# Patient Record
Sex: Male | Born: 1955 | Race: White | Hispanic: No | Marital: Single | Smoking: Current every day smoker
Health system: Southern US, Community
[De-identification: ages and names within clinical notes are randomized; demographics above are authoritative.]

## PROBLEM LIST (undated history)

## (undated) DIAGNOSIS — E7800 Pure hypercholesterolemia, unspecified: Secondary | ICD-10-CM

## (undated) DIAGNOSIS — N529 Male erectile dysfunction, unspecified: Secondary | ICD-10-CM

## (undated) DIAGNOSIS — K219 Gastro-esophageal reflux disease without esophagitis: Secondary | ICD-10-CM

## (undated) DIAGNOSIS — F419 Anxiety disorder, unspecified: Secondary | ICD-10-CM

## (undated) DIAGNOSIS — C4490 Unspecified malignant neoplasm of skin, unspecified: Secondary | ICD-10-CM

## (undated) DIAGNOSIS — M1990 Unspecified osteoarthritis, unspecified site: Secondary | ICD-10-CM

## (undated) DIAGNOSIS — I10 Essential (primary) hypertension: Secondary | ICD-10-CM

## (undated) DIAGNOSIS — I824Z2 Acute embolism and thrombosis of unspecified deep veins of left distal lower extremity: Secondary | ICD-10-CM

## (undated) DIAGNOSIS — G4730 Sleep apnea, unspecified: Secondary | ICD-10-CM

## (undated) HISTORY — DX: Gastro-esophageal reflux disease without esophagitis: K21.9

## (undated) HISTORY — DX: Pure hypercholesterolemia, unspecified: E78.00

## (undated) HISTORY — PX: SKIN BIOPSY: SHX1

## (undated) HISTORY — DX: Sleep apnea, unspecified: G47.30

## (undated) HISTORY — DX: Acute embolism and thrombosis of unspecified deep veins of left distal lower extremity: I82.4Z2

## (undated) HISTORY — PX: KNEE SURGERY: SHX244

## (undated) HISTORY — DX: Essential (primary) hypertension: I10

## (undated) HISTORY — PX: MELANOMA EXCISION: SHX5266

## (undated) HISTORY — DX: Anxiety disorder, unspecified: F41.9

## (undated) HISTORY — PX: TONSILLECTOMY: SUR1361

---

## 2018-08-18 ENCOUNTER — Emergency Department
Admission: EM | Admit: 2018-08-18 | Discharge: 2018-08-18 | Disposition: A | Discharge status: Home / Self Care | Payer: Self-pay | Attending: Emergency Medicine | Admitting: Emergency Medicine

## 2018-08-18 ENCOUNTER — Emergency Department: Payer: Self-pay

## 2018-08-18 ENCOUNTER — Encounter: Payer: Self-pay | Admitting: Emergency Medicine

## 2018-08-18 DIAGNOSIS — R3911 Hesitancy of micturition: Secondary | ICD-10-CM | POA: Insufficient documentation

## 2018-08-18 DIAGNOSIS — R350 Frequency of micturition: Secondary | ICD-10-CM | POA: Insufficient documentation

## 2018-08-18 DIAGNOSIS — F17210 Nicotine dependence, cigarettes, uncomplicated: Secondary | ICD-10-CM | POA: Insufficient documentation

## 2018-08-18 DIAGNOSIS — R3915 Urgency of urination: Secondary | ICD-10-CM | POA: Insufficient documentation

## 2018-08-18 HISTORY — DX: Unspecified osteoarthritis, unspecified site: M19.90

## 2018-08-18 HISTORY — DX: Unspecified malignant neoplasm of skin, unspecified: C44.90

## 2018-08-18 HISTORY — DX: Male erectile dysfunction, unspecified: N52.9

## 2018-08-18 LAB — BASIC METABOLIC PANEL
ANION GAP: 9 (ref 5–15)
BUN: 14 mg/dL (ref 8–23)
CO2: 29 mmol/L (ref 22–32)
CREATININE: 1.2 mg/dL (ref 0.61–1.24)
Calcium: 9.4 mg/dL (ref 8.9–10.3)
Chloride: 103 mmol/L (ref 98–111)
GFR calc Af Amer: >60 mL/min (ref >60)
GLUCOSE: 126 mg/dL — AB (ref 70–99)
Potassium: 3.8 mmol/L (ref 3.5–5.1)
Sodium: 141 mmol/L (ref 135–145)

## 2018-08-18 LAB — CBC
HEMATOCRIT: 46.8 % (ref 40.0–52.0)
Hemoglobin: 16 g/dL (ref 13.0–18.0)
MCH: 30.8 pg (ref 26.0–34.0)
MCHC: 34.2 g/dL (ref 32.0–36.0)
MCV: 90 fL (ref 80.0–100.0)
Platelets: 206 10*3/uL (ref 150–440)
RBC: 5.2 MIL/uL (ref 4.40–5.90)
RDW: 15.1 % — ABNORMAL HIGH (ref 11.5–14.5)
WBC: 6 10*3/uL (ref 3.8–10.6)

## 2018-08-18 LAB — URINALYSIS, COMPLETE (UACMP) WITH MICROSCOPIC
BILIRUBIN URINE: NEGATIVE
Bacteria, UA: NONE SEEN
GLUCOSE, UA: NEGATIVE mg/dL
KETONES UR: 5 mg/dL — AB
LEUKOCYTES UA: NEGATIVE
NITRITE: NEGATIVE
PH: 5 (ref 5.0–8.0)
Protein, ur: NEGATIVE mg/dL
Specific Gravity, Urine: 1.02 (ref 1.005–1.030)
Squamous Epithelial / LPF: NONE SEEN (ref 0–5)

## 2018-08-18 NOTE — ED Notes (Signed)
NAD noted at time of D/C. Pt denies questions or concerns. Pt ambulatory to the lobby at this time.  

## 2018-08-18 NOTE — ED Notes (Signed)
Pt presents to ED with c/c of urinary Sx including increased frequency/urgency and decreased flow when urinating. Also intermittent pain in R flank which pt associates with kidney. Sx have been ongoing for several months. Pt states concern for family Hx of bladder cancer. Secondary complaint of intermittent stool leakage occurring several times per week. Pt reports "wet"sensation after wiping and finds small quantities of liquid stool several minutes after bowel movement. Denies any pain with defication.

## 2018-08-18 NOTE — ED Notes (Signed)
EDP at bedside at this time to discuss results with patient.

## 2018-08-18 NOTE — ED Notes (Signed)
Patient transported to CT 

## 2018-08-18 NOTE — ED Triage Notes (Signed)
Patient presents to the ED with urinary issues, prostate issues and right flank pain.  Patient reports difficulty with ejaculation last week and reports frequent urination at night.  Patient states, I had testosterone pellets put in and I'm on cialis.  Patient states he thought he had a prostate infection and has been taking antibiotics.  Patient states, "I'm peeing clear and I think I need a scan or something to figure out what's going on."  Right flank pain began this morning.

## 2018-08-18 NOTE — ED Provider Notes (Signed)
[facility] Emergency Department Provider Note       Time seen: ----------------------------------------- 1:27 PM on 08/18/2018 -----------------------------------------   I have reviewed the triage vital signs and the nursing notes.  HISTORY   Chief Complaint Flank Pain    HPI Dustin Parsons is a 62 y.o. male with a history of arthritis, erectile dysfunction and skin cancer who presents to the ED for urinary symptoms including increased frequency and urgency with decreased flow.  Patient describes intermittent right flank pain which she associates with his right kidneys.  He states symptoms have been going on for several months.  He denies fevers, chills or other complaints.  Past Medical History:  Diagnosis Date  . Arthritis   . Erectile dysfunction   . Skin cancer     There are no active problems to display for this patient.   Past Surgical History:  Procedure Laterality Date  . KNEE SURGERY    . SKIN BIOPSY    . TONSILLECTOMY      Allergies Patient has no known allergies.  Social History Social History   Tobacco Use  . Smoking status: Current Every Day Smoker    Packs/day: 1.00    Types: Cigarettes  . Smokeless tobacco: Never Used  Substance Use Topics  . Alcohol use: Yes    Comment: weekends  . Drug use: Not on file   Review of Systems Constitutional: Negative for fever. Cardiovascular: Negative for chest pain. Respiratory: Negative for shortness of breath. Gastrointestinal: Positive for flank pain Genitourinary: Positive for polyuria, urinary hesitancy Musculoskeletal: Negative for back pain. Skin: Negative for rash. Neurological: Negative for headaches, focal weakness or numbness.  All systems negative/normal/unremarkable except as stated in the HPI  ____________________________________________   PHYSICAL EXAM:  VITAL SIGNS: ED Triage Vitals  Enc Vitals Group     BP --      Pulse Rate 08/18/18 1054 (!) 101      Resp 08/18/18 1054 18     Temp 08/18/18 1054 98.5 F (36.9 C)     Temp Source 08/18/18 1054 Oral     SpO2 08/18/18 1054 98 %     Weight 08/18/18 1055 215 lb (97.5 kg)     Height 08/18/18 1055 6\' 2"  (1.88 m)     Head Circumference --      Peak Flow --      Pain Score 08/18/18 1055 0     Pain Loc --      Pain Edu? --      Excl. in [place]? --    Constitutional: Alert and oriented. Well appearing and in no distress. Eyes: Conjunctivae are normal. Normal extraocular movements. Cardiovascular: Normal rate, regular rhythm. No murmurs, rubs, or gallops. Respiratory: Normal respiratory effort without tachypnea nor retractions. Breath sounds are clear and equal bilaterally. No wheezes/rales/rhonchi. Gastrointestinal: Soft and nontender. Normal bowel sounds Genitourinary: Circumcised, no tenderness, no swelling or mass, no testicular tenderness Musculoskeletal: Nontender with normal range of motion in extremities. No lower extremity tenderness nor edema. Neurologic:  Normal speech and language. No gross focal neurologic deficits are appreciated.  Skin:  Skin is warm, dry and intact. No rash noted. Psychiatric: Mood and affect are normal. Speech and behavior are normal.  ____________________________________________  ED COURSE:  As part of my medical decision making, I reviewed the following data within the [place] History obtained from family if available, nursing notes, old chart and ekg, as well as notes from prior ED visits. Patient presented for flank  pain and urinary symptoms, we will assess with labs and imaging as indicated at this time.   Procedures ____________________________________________   LABS (pertinent positives/negatives)  Labs Reviewed  URINALYSIS, COMPLETE (UACMP) WITH MICROSCOPIC - Abnormal; Notable for the following components:      Result Value   Color, Urine YELLOW (*)    APPearance CLEAR (*)    Hgb urine dipstick SMALL (*)    Ketones, ur 5 (*)     All other components within normal limits  BASIC METABOLIC PANEL - Abnormal; Notable for the following components:   Glucose, Bld 126 (*)    All other components within normal limits  CBC - Abnormal; Notable for the following components:   RDW 15.1 (*)    All other components within normal limits    RADIOLOGY Images were viewed by me  CT renal protocol IMPRESSION: No obstructive uropathy or nephrolithiasis.  No prostatomegaly.  ____________________________________________  DIFFERENTIAL DIAGNOSIS   UTI, pyelonephritis, renal colic, BPH  FINAL ASSESSMENT AND PLAN  Polyuria, urinary hesitancy   Plan: The patient had presented for urinary symptoms and flank pain. Patient's labs did not reveal any acute process. Patient's imaging was also negative.  He will be referred to urology for outpatient follow-up, potential urethral stricture.  He has already been on Flomax which did not help him.  I am not sure what else to prescribe.  He was also asking for refills of pain medicine or Xanax which we cannot provide.   Laurence Aly, MD   Note: This note was generated in part or whole with voice recognition software. Voice recognition is usually quite accurate but there are transcription errors that can and very often do occur. I apologize for any typographical errors that were not detected and corrected.     Earleen Newport, MD 08/18/18 [phone]

## 2018-09-09 ENCOUNTER — Encounter: Payer: Self-pay | Admitting: Urology

## 2018-09-09 ENCOUNTER — Ambulatory Visit: Payer: Self-pay | Admitting: Urology

## 2018-09-09 ENCOUNTER — Ambulatory Visit (INDEPENDENT_AMBULATORY_CARE_PROVIDER_SITE_OTHER): Payer: Self-pay | Admitting: Urology

## 2018-09-09 ENCOUNTER — Encounter

## 2018-09-09 VITALS — BP 145/88 | HR 93 | Ht 74.0 in | Wt 210.3 lb

## 2018-09-09 DIAGNOSIS — R3129 Other microscopic hematuria: Secondary | ICD-10-CM

## 2018-09-09 DIAGNOSIS — R3911 Hesitancy of micturition: Secondary | ICD-10-CM

## 2018-09-09 DIAGNOSIS — N401 Enlarged prostate with lower urinary tract symptoms: Secondary | ICD-10-CM

## 2018-09-09 DIAGNOSIS — N138 Other obstructive and reflux uropathy: Secondary | ICD-10-CM

## 2018-09-09 LAB — BLADDER SCAN AMB NON-IMAGING: Scan Result: 0

## 2018-09-09 NOTE — Progress Notes (Signed)
09/09/2018 3:34 PM   Dustin Parsons 11/28/1955 [id number]  Referring provider: No referring provider defined for this encounter.  Chief Complaint  Patient presents with  . urinary hesitancy    HPI: Patient is a 62 year old Caucasian male who presents today as a referral from [facility] ED for right flank pain, difficulty urinating and ejaculating.  His symptoms started in July.  He has been having frequency, dysuria, urgency, nocturia, incontinence, intermittency, hesitancy, straining to urinate and a weak urinary stream.  He states that he had been on tamsulosin in the past, but it interfered with erections and he discontinued the medication.  He had also been on dutasteride in the past and felt that it was interfering with his sexual activity as well.    He had been on pellets for testosterone therapy and his last insertion was 5 weeks ago.  His first was in February.   This was done at a men's clinic in [facility].   He stated his testosterone level prior to the testosterone pellet insertion was 230.   He stated that his PSA was 0 point something at that clinic.  He was having difficulty ejaculating after having a satisfactory ejaculation during sexual activity with his next sexual attempt during the same interlude.  He was hoping that testosterone pellets would treat this issue, but it did not.  He is also taking Cialis 10 mg on demand for sexual activity.  He also states that he has been having right-sided flank pain that radiates into the right waist and down into the groin.  CT renal stone study in the ED on 08/18/2018 no obstructive uropathy or nephrolithiasis.  No prostatomegaly.  His UA was positive for 0-5 RBC's in the ED. His creatinine was 1.20.   His WBC count was 6.0.  He is a smoker, 1 ppd x 45 years.  He states his father and grandfather have a history of prostate cancer.  His father and mother have a history of kidney failure.  His father has a history of blood in the  urine.  His UA today is negative.  His PVR is 0 mL.    He is also complaining of a clear discharge from his anus after sexual activity with his wife.    PMH: Past Medical History:  Diagnosis Date  . Acid reflux   . Anxiety   . Arthritis   . Erectile dysfunction   . High cholesterol   . Hypertension   . Lower leg DVT (deep venous thromboembolism), acute, left ([place])   . Skin cancer   . Sleep apnea     Surgical History: Past Surgical History:  Procedure Laterality Date  . KNEE SURGERY    . MELANOMA EXCISION    . SKIN BIOPSY    . TONSILLECTOMY      Home Medications:  Allergies as of 09/09/2018   No Known Allergies     Medication List        Accurate as of 09/09/18  3:34 PM. Always use your most recent med list.          aspirin 81 MG chewable tablet Chew 81 mg by mouth daily.   escitalopram 20 MG tablet Commonly known as:  LEXAPRO Take 20 mg by mouth daily.   lisinopril 5 MG tablet Commonly known as:  PRINIVIL,ZESTRIL Take 5 mg by mouth daily.   tadalafil 10 MG tablet Commonly known as:  CIALIS Take 10 mg by mouth daily as needed for erectile dysfunction.  Allergies: No Known Allergies  Family History: Family History  Problem Relation Age of Onset  . Cancer Maternal Grandfather   . Kidney failure Maternal Grandfather   . Cancer Paternal Grandfather   . Hematuria Father   . Kidney failure Father   . Prostate cancer Father   . Kidney failure Mother     Social History:  reports that he has been smoking cigarettes. He has been smoking about 1.00 pack per day. He has never used smokeless tobacco. He reports that he drinks alcohol. He reports that he does not use drugs.  ROS: UROLOGY Frequent Urination?: Yes Hard to postpone urination?: Yes Burning/pain with urination?: Yes Get up at night to urinate?: Yes Leakage of urine?: Yes Urine stream starts and stops?: Yes Trouble starting stream?: Yes Do you have to strain to urinate?:  Yes Blood in urine?: Yes Urinary tract infection?: Yes Sexually transmitted disease?: No Injury to kidneys or bladder?: No Painful intercourse?: No Weak stream?: Yes Erection problems?: Yes Penile pain?: Yes  Gastrointestinal Nausea?: No Vomiting?: No Indigestion/heartburn?: Yes Diarrhea?: Yes Constipation?: Yes  Constitutional Fever: No Night sweats?: No Weight loss?: No Fatigue?: No  Skin Skin rash/lesions?: No Itching?: No  Eyes Blurred vision?: No Double vision?: No  Ears/Nose/Throat Sore throat?: No Sinus problems?: Yes  Hematologic/Lymphatic Swollen glands?: No Easy bruising?: No  Cardiovascular Leg swelling?: No Chest pain?: No  Respiratory Cough?: Yes Shortness of breath?: Yes  Endocrine Excessive thirst?: No  Musculoskeletal Back pain?: Yes Joint pain?: Yes  Neurological Headaches?: Yes Dizziness?: No  Psychologic Depression?: Yes Anxiety?: Yes  Physical Exam: BP (!) 145/88 (BP Location: Left Arm, Patient Position: Sitting, Cuff Size: Normal)   Pulse 93   Ht 6\' 2"  (1.88 m)   Wt 210 lb 4.8 oz (95.4 kg)   BMI 27.00 kg/m   Constitutional:  Well nourished. Alert and oriented, No acute distress. HEENT: [place] AT, moist mucus membranes.  Trachea midline, no masses. Cardiovascular: No clubbing, cyanosis, or edema. Respiratory: Normal respiratory effort, no increased work of breathing. GI: Abdomen is soft, non tender, non distended, no abdominal masses. Liver and spleen not palpable.  No hernias appreciated.  Stool sample for occult testing is not indicated.   GU: No CVA tenderness.  No bladder fullness or masses.  Patient with circumcised phallus.  Urethral meatus is patent.  No penile discharge. No penile lesions or rashes. Scrotum without lesions, cysts, rashes and/or edema.  Testicles are located scrotally bilaterally. No masses are appreciated in the testicles. Left and right epididymis are normal. Rectal: Patient with  normal sphincter  tone. Anus and perineum without scarring or rashes. No rectal masses are appreciated. Prostate is approximately 45 grams, no nodules are appreciated. Seminal vesicles are normal.  Testosterone pellets are located under the skin on right buttocks.   Skin: No rashes, bruises or suspicious lesions. Lymph: No cervical or inguinal adenopathy. Neurologic: Grossly intact, no focal deficits, moving all 4 extremities. Psychiatric: Normal mood and affect.  Laboratory Data: Lab Results  Component Value Date   WBC 6.0 08/18/2018   HGB 16.0 08/18/2018   HCT 46.8 08/18/2018   MCV 90.0 08/18/2018   PLT 206 08/18/2018    Lab Results  Component Value Date   CREATININE 1.20 08/18/2018    No results found for: PSA  No results found for: TESTOSTERONE  No results found for: HGBA1C  No results found for: TSH  No results found for: CHOL, HDL, CHOLHDL, VLDL, LDLCALC  No results found for: AST No  results found for: ALT No components found for: ALKALINEPHOPHATASE No components found for: BILIRUBINTOTAL  No results found for: ESTRADIOL  Urinalysis Negative.  See Epic.   I have reviewed the labs.   Pertinent Imaging: CLINICAL DATA:  62 year old male with right flank pain and prostate issues. Difficulty urinating and ejaculating.  EXAM: CT ABDOMEN AND PELVIS WITHOUT CONTRAST  TECHNIQUE: Multidetector CT imaging of the abdomen and pelvis was performed following the standard protocol without IV contrast.  COMPARISON:  None.  FINDINGS: LOWER CHEST: Lung bases are clear. Included heart size is normal. No pericardial effusion.  HEPATOBILIARY: The unenhanced liver is unremarkable given limitations of a noncontrast study. No space-occupying mass or biliary dilatation is apparent. Differential layering densities within the gallbladder likely represents biliary sludge. No secondary signs of acute cholecystitis.  PANCREAS: Normal  SPLEEN: Normal  ADRENALS/URINARY TRACT: Kidneys  are orthotopic. No nephrolithiasis, hydronephrosis or solid renal masses. The unopacified ureters are normal in course and caliber. Urinary bladder is decompressed in appearance and unremarkable for the degree of distention. Normal adrenal glands.  STOMACH/BOWEL: The stomach, small and large bowel are normal in course and caliber without inflammatory changes. Normal appendix.  VASCULAR/LYMPHATIC: Aortoiliac vessels are normal in course and caliber. Mild aortoiliac atherosclerosis. No lymphadenopathy by CT size criteria.  REPRODUCTIVE: Normal size prostate measuring approximately 3.4 x 3.5 x 5.1 cm. Seminal vesicles are top-normal size.  OTHER: No intraperitoneal free fluid or free air.  MUSCULOSKELETAL: Chronic mild superior endplate depression of B76. No retropulsion. No aggressive osseous lesions nor fracture.  IMPRESSION: No obstructive uropathy or nephrolithiasis.  No prostatomegaly.   Electronically Signed   By: Ashley Royalty M.D.   On: 08/18/2018 14:05 I have independently reviewed the films and did not see any stones.   Assessment & Plan:    1. Microscopic hematuria I explained to the patient that there are a number of causes that can be associated with blood in the urine, such as stones, BPH, UTI's, damage to the urinary tract and/or cancer. At this time, I felt that the patient warranted further urologic evaluation with 3 or greater RBC's/hpf on microscopic evaluation of the urine.  The AUA guidelines state that a CT urogram is the preferred imaging study to evaluate hematuria -I explained that the CT scan that was performed in the emergency room would not be able to rule out any urological tumors due to the lack of detail without the contrast I explained to the patient that a contrast material will be injected into a vein and that in rare instances, an allergic reaction can result and may even life threatening   The patient denies any allergies to contrast,  iodine and/or seafood and is not taking metformin Following the imaging study,  I've recommended a cystoscopy. I described how this is performed, typically in an office setting with a flexible cystoscope. We described the risks, benefits, and possible side effects, the most common of which is a minor amount of blood in the urine and/or burning which usually resolves in 24 to 48 hours.   The patient had the opportunity to ask questions which were answered. Based upon this discussion, the patient is willing to proceed. Therefore, I've ordered: a CT Urogram and cystoscopy. The patient will return following all of the above for discussion of the results.  UA Urine culture   2. BPH with LU TS Continue conservative management, avoiding bladder irritants and timed voiding's Patient is scheduled for a cystoscopy Bladder Scan (Post Void Residual) in  office Will request records from the men's clinic in [facility] as patient states he just had his PSA drawn   3. ? Testosterone deficiency Seen at clinic in [facility] - requesting records   Return for CT Urogram report and cystoscopy.  These notes generated with voice recognition software. I apologize for typographical errors.  Zara Council, PA-C  [facility] [address]  [place], [place] [address] [phone]

## 2018-09-10 LAB — MICROSCOPIC EXAMINATION: RBC, UA: NONE SEEN /hpf (ref 0–2)

## 2018-09-10 LAB — URINALYSIS, COMPLETE
BILIRUBIN UA: NEGATIVE
Glucose, UA: NEGATIVE
Ketones, UA: NEGATIVE
LEUKOCYTES UA: NEGATIVE
Nitrite, UA: NEGATIVE
PH UA: 7 (ref 5.0–7.5)
PROTEIN UA: NEGATIVE
Specific Gravity, UA: 1.02 (ref 1.005–1.030)
Urobilinogen, Ur: 0.2 mg/dL (ref 0.2–1.0)

## 2018-09-13 ENCOUNTER — Other Ambulatory Visit: Payer: Self-pay

## 2018-09-13 ENCOUNTER — Telehealth: Payer: Self-pay | Admitting: Urology

## 2018-09-13 LAB — CULTURE, URINE COMPREHENSIVE

## 2018-09-13 NOTE — Telephone Encounter (Signed)
Error

## 2018-09-15 ENCOUNTER — Ambulatory Visit: Payer: Self-pay

## 2018-09-15 ENCOUNTER — Ambulatory Visit
Admission: RE | Admit: 2018-09-15 | Discharge: 2018-09-15 | Disposition: A | Discharge status: Home / Self Care | Payer: Self-pay | Source: Ambulatory Visit | Attending: Urology | Admitting: Urology

## 2018-09-15 DIAGNOSIS — R918 Other nonspecific abnormal finding of lung field: Secondary | ICD-10-CM | POA: Insufficient documentation

## 2018-09-15 DIAGNOSIS — I700 Atherosclerosis of aorta: Secondary | ICD-10-CM | POA: Insufficient documentation

## 2018-09-15 DIAGNOSIS — Q625 Duplication of ureter: Secondary | ICD-10-CM | POA: Insufficient documentation

## 2018-09-15 DIAGNOSIS — R3129 Other microscopic hematuria: Secondary | ICD-10-CM

## 2018-09-15 MED ORDER — IOPAMIDOL (ISOVUE-300) INJECTION 61%
125.0000 mL | Freq: Once | INTRAVENOUS | Status: AC | PRN
  Administered 2018-09-15: 125 mL via INTRAVENOUS

## 2018-09-17 ENCOUNTER — Telehealth: Payer: Self-pay | Admitting: Urology

## 2018-09-17 NOTE — Telephone Encounter (Signed)
Pt called asking about CT results. 

## 2018-09-17 NOTE — Telephone Encounter (Signed)
He should be scheduled for a cystoscopy.  The physician who is performing the cysto will go over the CT at that time

## 2018-09-24 ENCOUNTER — Telehealth: Payer: Self-pay | Admitting: Urology

## 2018-09-24 ENCOUNTER — Encounter: Payer: Self-pay | Admitting: Urology

## 2018-09-24 ENCOUNTER — Encounter

## 2018-09-24 ENCOUNTER — Ambulatory Visit (INDEPENDENT_AMBULATORY_CARE_PROVIDER_SITE_OTHER): Payer: Self-pay | Admitting: Urology

## 2018-09-24 VITALS — BP 154/94 | HR 94 | Ht 74.0 in | Wt 216.4 lb

## 2018-09-24 DIAGNOSIS — R3129 Other microscopic hematuria: Secondary | ICD-10-CM

## 2018-09-24 LAB — URINALYSIS, COMPLETE
Bilirubin, UA: NEGATIVE
Glucose, UA: NEGATIVE
Ketones, UA: NEGATIVE
Leukocytes, UA: NEGATIVE
NITRITE UA: NEGATIVE
Protein, UA: NEGATIVE
Specific Gravity, UA: 1.025 (ref 1.005–1.030)
Urobilinogen, Ur: 0.2 mg/dL (ref 0.2–1.0)
pH, UA: 5.5 (ref 5.0–7.5)

## 2018-09-24 LAB — MICROSCOPIC EXAMINATION
BACTERIA UA: NONE SEEN
Epithelial Cells (non renal): NONE SEEN /hpf (ref 0–10)
WBC, UA: NONE SEEN /hpf (ref 0–5)

## 2018-09-24 MED ORDER — ALPRAZOLAM 1 MG PO TABS: 0.5000 mg | ORAL_TABLET | Freq: Two times a day (BID) | ORAL | 1 refills | Status: AC | PRN

## 2018-09-24 MED ORDER — LIDOCAINE HCL URETHRAL/MUCOSAL 2 % EX GEL
1.0000 "application " | Freq: Once | CUTANEOUS | Status: AC
  Administered 2018-09-24: 1 via URETHRAL

## 2018-09-24 NOTE — Progress Notes (Addendum)
   09/24/18  CC:  Chief Complaint  Patient presents with  . Cysto    HPI: Saw Zara Council and found to have microhematuria.  Intermittent symptoms of hesitancy and dysuria.  Takes tadalafil 5 mg daily which helps his symptoms.  Has been on tamsulosin in the past which he states works initially and then his symptoms seem to worsen.  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  Blood pressure (!) 154/94, pulse 94, height 6\' 2"  (1.88 m), weight 216 lb 6.4 oz (98.2 kg). NED. A&Ox3.   No respiratory distress   Abd soft, NT, ND Normal phallus with bilateral descended testicles  Cystoscopy Procedure Note  Patient identification was confirmed, informed consent was obtained, and patient was prepped using Betadine solution.  Lidocaine jelly was administered per urethral meatus.     Pre-Procedure: - Inspection reveals a normal caliber ureteral meatus.  Procedure: The flexible cystoscope was introduced without difficulty - No urethral strictures/lesions are present. 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  He does have tamsulosin from a previous prescription and when his symptoms are worse recommend he try taking as needed.  He was incidentally noted to have small pulmonary nodules.  He does have a smoking history.  A chest CT in 1 year is recommended.  He does not have a primary provider however is in the process of looking for one.  He was previously on Xanax 0.5 mg twice daily and requested an Rx.  I told him I could only give a limited supply and additional refills would need to be prescribed by  an established PCP.  Return in about 3 months (around 12/25/2018) for Recheck with Larene [facility].  Abbie Sons, MD

## 2018-09-24 NOTE — Telephone Encounter (Signed)
Pt states he'd like his Rx sent to [facility] on [address] please. Thanks

## 2018-09-27 ENCOUNTER — Ambulatory Visit: Payer: Self-pay | Admitting: Urology

## 2018-10-08 ENCOUNTER — Other Ambulatory Visit: Payer: Self-pay | Admitting: Urology

## 2019-01-19 NOTE — Progress Notes (Incomplete)
01/21/2019 1:00 PM   Dustin Parsons [birth year] [id number]  Referring provider: No referring provider defined for this encounter.  No chief complaint on file.   HPI: Dustin Parsons is a 63 y.o. male Caucasian who was previously seen as a referral from [facility] ED for right flank pain, difficulty urinating and ejaculating, who presents today for a three month follow up on symptoms.  His symptoms started in July.  He has been having frequency, dysuria, urgency, nocturia, incontinence, intermittency, hesitancy, straining to urinate and a weak urinary stream.  He states that he had been on tamsulosin in the past, but it interfered with erections and he discontinued the medication.  He had also been on dutasteride in the past and felt that it was interfering with his sexual activity as well.    He had been on pellets for testosterone therapy and his last insertion was 5 weeks prior to his 09/09/2018 visit.  His first was in February.   This was done at a men's clinic in [facility].  He stated his testosterone level prior to the testosterone pellet insertion was 230.  He stated that his PSA was 0 point something at that clinic.  He was having difficulty ejaculating after having a satisfactory ejaculation during sexual activity with his next sexual attempt during the same interlude.  He was hoping that testosterone pellets would treat this issue, but it did not.  He is also taking Cialis 10 mg on demand for sexual activity.  He also stated on his 09/09/2018 visit that he had been having right-sided flank pain that radiates into the right waist and down into the groin.  CT renal stone study in the ED on 08/18/2018 no obstructive uropathy or nephrolithiasis.  No prostatomegaly.  His UA was positive for 0-5 RBC's in the ED. His creatinine was 1.20.   His WBC count was 6.0.  He *** is a smoker, 1 ppd x 45 years.  He stated his father and grandfather have a history of prostate cancer.  His father and mother  have a history of kidney failure.  His father has a history of blood in the urine.  His UA on 09/09/2018 was negative.  His PVR was 0 mL.    He was also complaining on 09/09/2018 of a clear discharge from his anus after sexual activity with his wife.  On CTU (09/15/2018) and cystoscopy (09/25/2018 with Dr. Bernardo Heater), no significant abnormalities were found.  It was recommended that he try taking tamsulosin he had from a previous prescription when symptoms were worse.  Patient was incidentally noted to have small pulmonary nodules, and a chest CT in 1 year was recommended.  He was, at the time, in the process of looking for a PCP with whom to follow up on that and his Xanax 0.5 mg bid prescription.  ***  PMH: Past Medical History:  Diagnosis Date   Acid reflux    Anxiety    Arthritis    Erectile dysfunction    High cholesterol    Hypertension    Lower leg DVT (deep venous thromboembolism), acute, left (HCC)    Skin cancer    Sleep apnea     Surgical History: Past Surgical History:  Procedure Laterality Date   KNEE SURGERY     MELANOMA EXCISION     SKIN BIOPSY     TONSILLECTOMY      Home Medications:  Allergies as of 01/21/2019   No Known Allergies     Medication List  Accurate as of January 19, 2019  1:00 PM. Always use your most recent med list.        ALPRAZolam 1 MG tablet Commonly known as:  XANAX Take 0.5 tablets (0.5 mg total) by mouth 2 (two) times daily as needed for anxiety.   aspirin 81 MG chewable tablet Chew 81 mg by mouth daily.   escitalopram 20 MG tablet Commonly known as:  LEXAPRO Take 20 mg by mouth daily.   lisinopril 5 MG tablet Commonly known as:  PRINIVIL,ZESTRIL Take 5 mg by mouth daily.   tadalafil 10 MG tablet Commonly known as:  CIALIS Take 10 mg by mouth daily as needed for erectile dysfunction.       Allergies: No Known Allergies  Family History: Family History  Problem Relation Age of Onset   Cancer  Maternal Grandfather    Kidney failure Maternal Grandfather    Cancer Paternal Grandfather    Hematuria Father    Kidney failure Father    Prostate cancer Father    Kidney failure Mother     Social History:  reports that he has been smoking cigarettes. He has been smoking about 1.00 pack per day. He has never used smokeless tobacco. He reports current alcohol use. He reports that he does not use drugs.  ROS:                                        Physical Exam: There were no vitals taken for this visit.  Constitutional:  Well nourished. Alert and oriented, No acute distress. {HEENT: [place] AT, moist mucus membranes.  Trachea midline, no masses.} Cardiovascular: No clubbing, cyanosis, or edema. Respiratory: Normal respiratory effort, no increased work of breathing. {GI: Abdomen is soft, non tender, non distended, no abdominal masses. Liver and spleen not palpable.  No hernias appreciated.  Stool sample for occult testing is not indicated.} {GU: No CVA tenderness.  No bladder fullness or masses.  Patient with circumcised/uncircumcised phallus. ***Foreskin easily retracted***  Urethral meatus is patent.  No penile discharge. No penile lesions or rashes. Scrotum without lesions, cysts, rashes and/or edema.  Testicles are located scrotally bilaterally. No masses are appreciated in the testicles. Left and right epididymis are normal.} {Rectal: Patient with normal sphincter tone.  Anus and perineum without scarring or rashes.  No rectal masses are appreciated. Prostate is approximately *** grams, *** nodules are appreciated.  Seminal vesicles are normal.} Skin: No rashes, bruises or suspicious lesions. {Lymph: No cervical or inguinal adenopathy.} Neurologic: Grossly intact, no focal deficits, moving all 4 extremities. Psychiatric: Normal mood and affect.  Laboratory Data: Lab Results  Component Value Date   WBC 6.0 08/18/2018   HGB 16.0 08/18/2018   HCT 46.8  08/18/2018   MCV 90.0 08/18/2018   PLT 206 08/18/2018    Lab Results  Component Value Date   CREATININE 1.20 08/18/2018    No results found for: PSA  No results found for: TESTOSTERONE  No results found for: HGBA1C  No results found for: TSH  No results found for: CHOL, HDL, CHOLHDL, VLDL, LDLCALC  No results found for: AST No results found for: ALT No components found for: ALKALINEPHOPHATASE No components found for: BILIRUBINTOTAL  No results found for: ESTRADIOL  Urinalysis Negative.  See Epic.    I have reviewed the labs.  Pertinent Imaging:  Results for orders placed during the hospital encounter of 09/15/18  CT HEMATURIA WORKUP   Narrative CLINICAL DATA:  63 year old male with history of right-sided flank pain and difficulty urinating and ejaculating.  EXAM: CT ABDOMEN AND PELVIS WITHOUT AND WITH CONTRAST  TECHNIQUE: Multidetector CT imaging of the abdomen and pelvis was performed following the standard protocol before and following the bolus administration of intravenous contrast.  CONTRAST:  112mL ISOVUE-300 IOPAMIDOL (ISOVUE-300) INJECTION 61%  COMPARISON:  CT the abdomen and pelvis 08/18/2018.  FINDINGS: Lower chest: Small pulmonary nodules are again noted in the lung bases bilaterally, largest of which measures 5 mm in the right lower lobe (axial image 6 of series 19). Small hiatal hernia.  Hepatobiliary: No suspicious cystic or solid hepatic lesions. No intra or extrahepatic biliary ductal dilatation. Gallbladder is normal in appearance.  Pancreas: No pancreatic mass. No pancreatic ductal dilatation. No pancreatic or peripancreatic fluid or inflammatory changes.  Spleen: Unremarkable.  Adrenals/Urinary Tract: Precontrast images demonstrate no calcifications within the collecting system of either kidney, along the course of either ureter, or within the lumen of the urinary bladder. Post contrast images demonstrate no focal solid  renal lesions. Post contrast delayed images demonstrate no definite filling defects within the collecting system of either kidney, along the course of either ureter, or within the lumen of the urinary bladder to strongly suggest the presence of a urothelial neoplasm. 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 Urinary bladder is normal in appearance. Bilateral adrenal glands are normal in appearance.  Stomach/Bowel: Normal appearance of the stomach. No pathologic dilatation of small bowel or colon. The appendix is not confidently identified and may be surgically absent. Regardless, there are no inflammatory changes noted adjacent to the cecum to suggest the presence of an acute appendicitis at this time.  Vascular/Lymphatic: Aortic atherosclerosis, without evidence of aneurysm or dissection in the abdominal or pelvic vasculature. No lymphadenopathy noted in the abdomen or pelvis.  Reproductive: Prostate gland and seminal vesicles are unremarkable in appearance.  Other: No significant volume of ascites.  No pneumoperitoneum.  Musculoskeletal: There are no aggressive appearing lytic or blastic lesions noted in the visualized portions of the skeleton.  IMPRESSION: 1. No acute findings are noted in the abdomen or pelvis to account for the patient's symptoms. Specifically, no urinary tract calculi no findings of urinary tract obstruction are noted at this time. 2. 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 3. Small pulmonary nodules noted in the lung bases, largest of which measures 5 mm in the right lower lobe. These are nonspecific. No follow-up needed if patient is low-risk (and has no known or suspected primary neoplasm). Non-contrast chest CT can be considered in 12 months if patient is high-risk. This recommendation follows the consensus statement: Guidelines for Management of  Incidental Pulmonary Nodules Detected on CT Images: From the Fleischner Society 2017; Radiology 2017; 284:228-243. 4. Aortic atherosclerosis.   Electronically Signed   By: Vinnie Langton M.D.   On: 09/15/2018 21:01    ***  Assessment & Plan:    1. Microscopic hematuria *** - I explained to the patient that there are a number of causes that can be associated with blood in the urine, such as stones, BPH, UTI's, damage to the urinary tract and/or cancer. *** - At this time, I felt that the patient warranted further urologic evaluation with 3 or greater RBC's/hpf on microscopic evaluation of the urine.  The AUA guidelines state that a CT urogram is the preferred  imaging study to evaluate hematuria -I explained that the CT scan that was performed in the emergency room would not be able to rule out any urological tumors due to the lack of detail without the contrast *** - I explained to the patient that a contrast material will be injected into a vein and that in rare instances, an allergic reaction can result and may even life threatening   The patient denies any allergies to contrast, iodine and/or seafood and is not taking metformin *** - Following the imaging study,  I've recommended a cystoscopy. I described how this is performed, typically in an office setting with a flexible cystoscope. We described the risks, benefits, and possible side effects, the most common of which is a minor amount of blood in the urine and/or burning which usually resolves in 24 to 48 hours.   *** - The patient had the opportunity to ask questions which were answered. Based upon this discussion, the patient is willing to proceed. Therefore, I've ordered: a CT Urogram and cystoscopy. *** - The patient will return following all of the above for discussion of the results.  *** - UA *** - Urine culture  2. BPH with LU TS - Continue conservative management, avoiding bladder irritants and timed voiding's *** - Patient  is scheduled for a cystoscopy - Bladder Scan (Post Void Residual) in office *** - Will request records from the men's clinic in [facility] as patient states he just had his PSA drawn   *** 3. ? Testosterone deficiency *** - Seen at clinic in [facility] - requesting records   No follow-ups on file.  Zara Council, PA-C  [facility] [address]  [place], [place] [address] [phone]  I, Adele Schilder, am acting as a Education administrator for [employer], PA-C.   {Add Scribe Attestation Statement}

## 2019-01-21 ENCOUNTER — Encounter: Payer: Self-pay | Admitting: Urology

## 2019-01-21 ENCOUNTER — Ambulatory Visit: Payer: Self-pay | Admitting: Urology

## 2019-03-12 IMAGING — CT CT ABD-PEL WO/W CM
3 of 12 series · 11 of 46 positions shown, 17 images · IV contrast (iopamidol)
Comparison: CT the abdomen and pelvis 08/18/2018.

CLINICAL DATA: 62-year-old male with history of right-sided flank
pain and difficulty urinating and ejaculating.

EXAM:
CT ABDOMEN AND PELVIS WITHOUT AND WITH CONTRAST
TECHNIQUE: Multidetector CT imaging of the abdomen and pelvis was performed
following the standard protocol before and following the bolus
administration of intravenous contrast.
CONTRAST:  125mL EHTGDX-AHH IOPAMIDOL (EHTGDX-AHH) INJECTION 61%

[Series 2: without pre · axial · non-contrast · 0.81mm/px · z∈[-1689,-1329]mm · 7 of 96 slices shown, 12 images]
[im 12/96  soft-tissue]
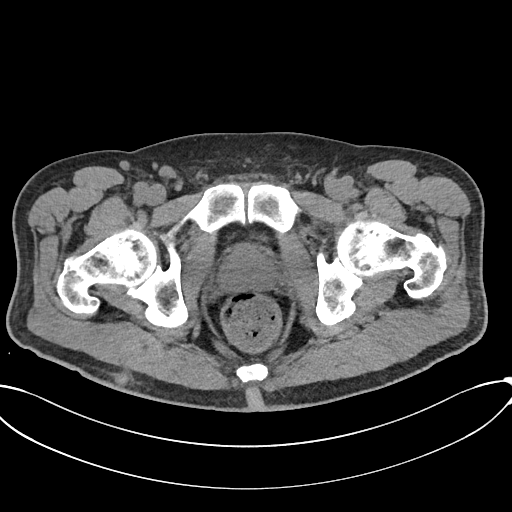
[im 12/96  bone]
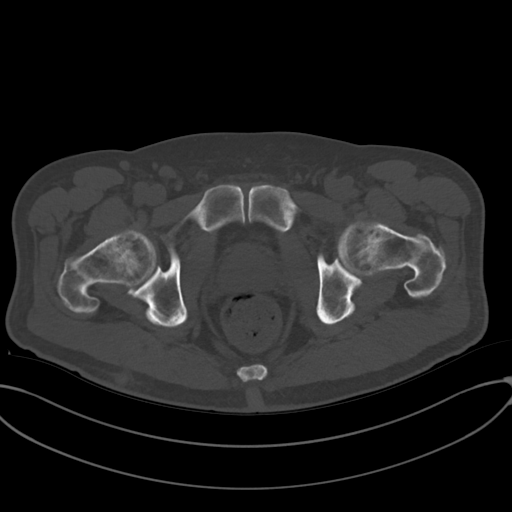
[im 24/96  soft-tissue]
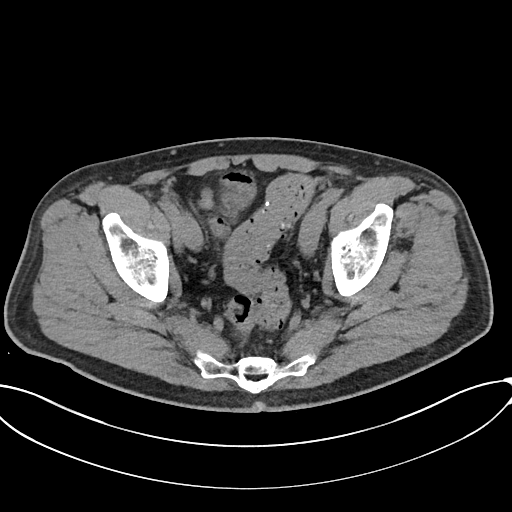
[im 36/96  soft-tissue]
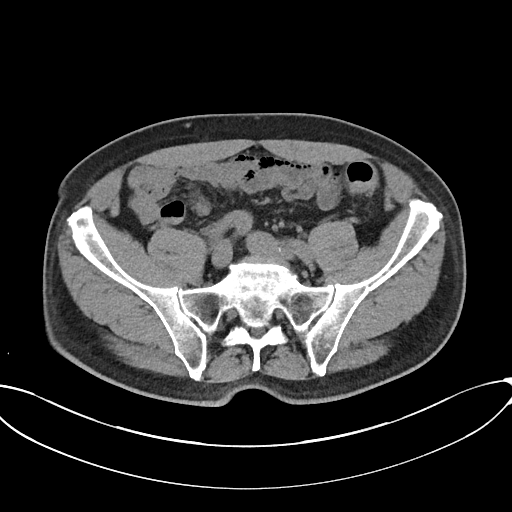
[im 48/96  soft-tissue]
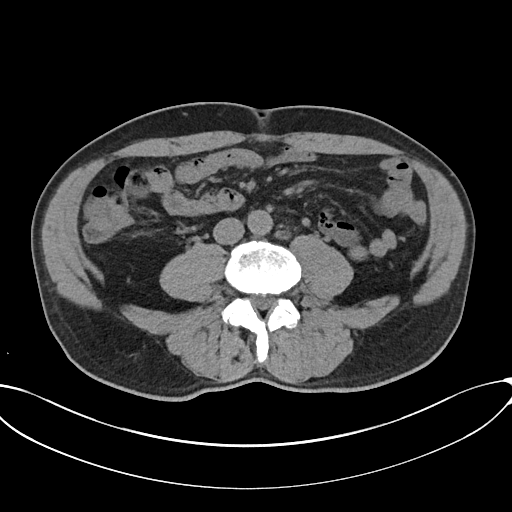
[im 48/96  lung]
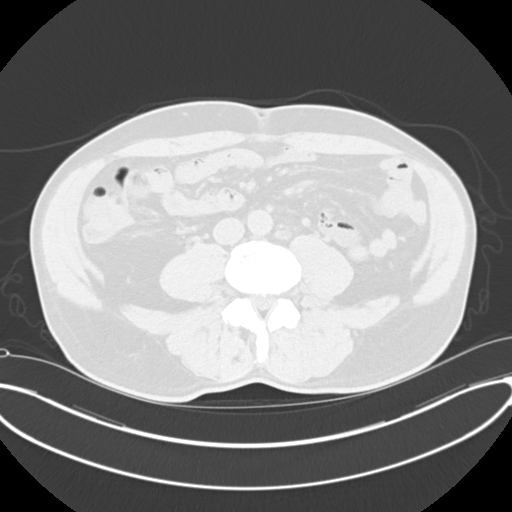
[im 60/96  soft-tissue]
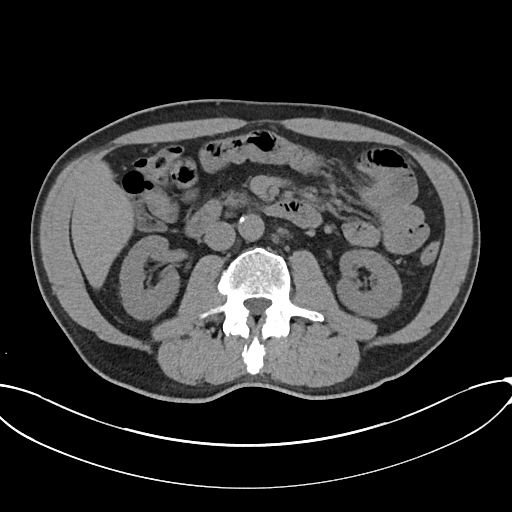
[im 60/96  lung]
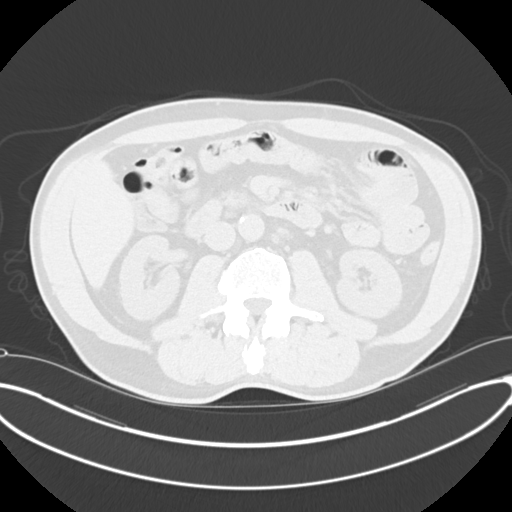
[im 72/96  soft-tissue]
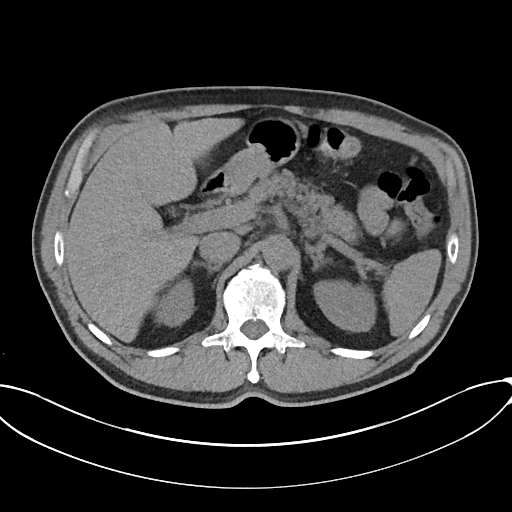
[im 72/96  lung]
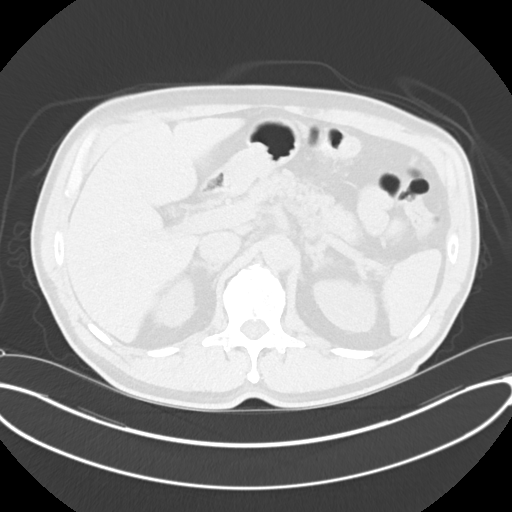
[im 84/96  soft-tissue]
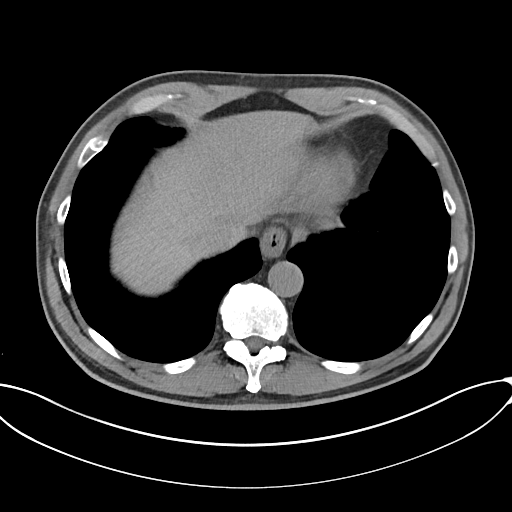
[im 84/96  lung]
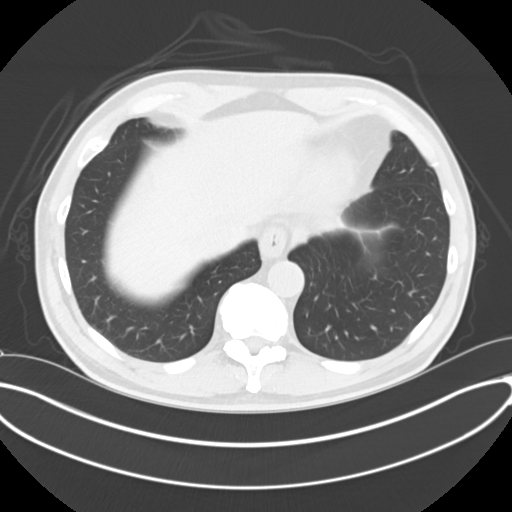

[Series 5: cor without without pre · coronal · non-contrast · 0.81mm/px · 2 of 203 slices shown, 3 images]
[im 68/203  soft-tissue]
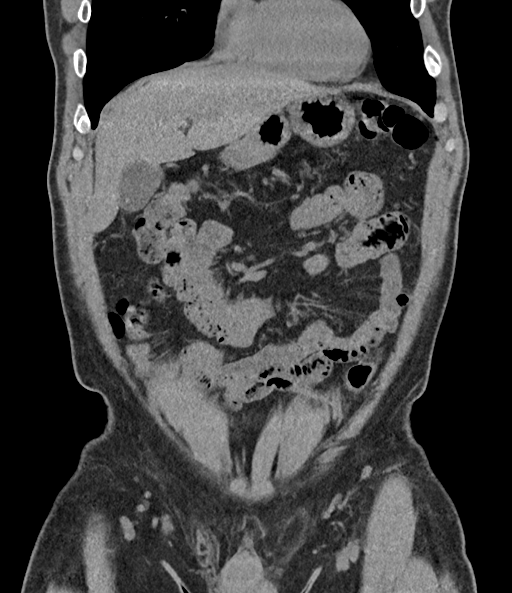
[im 68/203  bone]
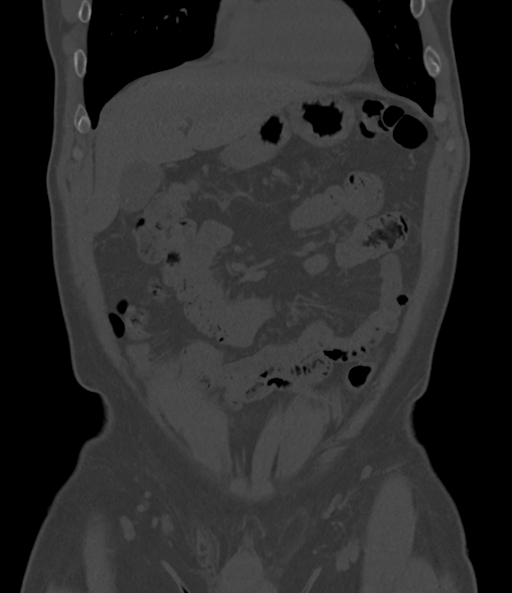
[im 135/203  soft-tissue]
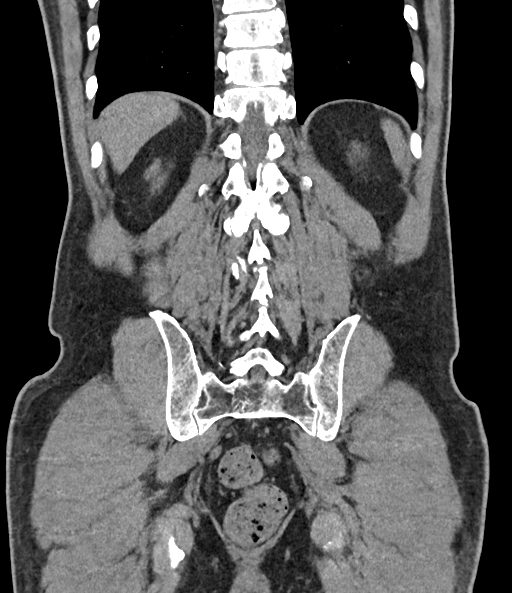

[Series 9: axial with hematuria with · axial · 0.75mm/px · z∈[-1687,-1622]mm · 2 of 95 slices shown]
[im 14/95  soft-tissue]
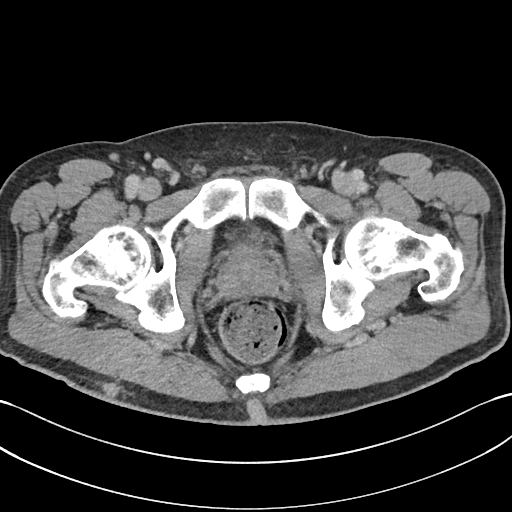
[im 27/95  soft-tissue]
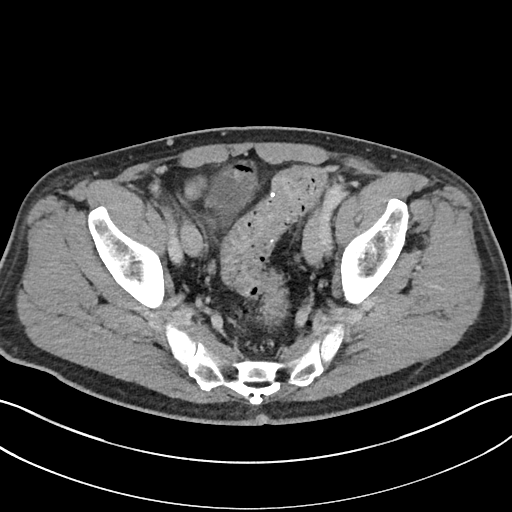

[11 of 46 positions shown; findings below may reference images not displayed]

FINDINGS: Lower chest: Small pulmonary nodules are again noted in the lung
bases bilaterally, largest of which measures 5 mm in the right lower
lobe (axial image 6 of series 19). Small hiatal hernia.

Hepatobiliary: No suspicious cystic or solid hepatic lesions. No
intra or extrahepatic biliary ductal dilatation. Gallbladder is
normal in appearance.

Pancreas: No pancreatic mass. No pancreatic ductal dilatation. No
pancreatic or peripancreatic fluid or inflammatory changes.

Spleen: Unremarkable.

Adrenals/Urinary Tract: Precontrast images demonstrate no
calcifications within the collecting system of either kidney, along
the course of either ureter, or within the lumen of the urinary
bladder. Post contrast images demonstrate no focal solid renal
lesions. Post contrast delayed images demonstrate no definite
filling defects within the collecting system of either kidney, along
the course of either ureter, or within the lumen of the urinary
bladder to strongly suggest the presence of a urothelial neoplasm.
Duplication of the right renal collecting system and right ureter
extending to the level of the right UVJ. Urinary bladder is normal
in appearance. Bilateral adrenal glands are normal in appearance.

Stomach/Bowel: Normal appearance of the stomach. No pathologic
dilatation of small bowel or colon. The appendix is not confidently
identified and may be surgically absent. Regardless, there are no
inflammatory changes noted adjacent to the cecum to suggest the
presence of an acute appendicitis at this time.

Vascular/Lymphatic: Aortic atherosclerosis, without evidence of
aneurysm or dissection in the abdominal or pelvic vasculature. No
lymphadenopathy noted in the abdomen or pelvis.

Reproductive: Prostate gland and seminal vesicles are unremarkable
in appearance.

Other: No significant volume of ascites.  No pneumoperitoneum.

Musculoskeletal: There are no aggressive appearing lytic or blastic
lesions noted in the visualized portions of the skeleton.
IMPRESSION: 1. No acute findings are noted in the abdomen or pelvis to account
for the patient's symptoms. Specifically, no urinary tract calculi
no findings of urinary tract obstruction are noted at this time.
2. Duplication of the right renal collecting system and right
ureter.
3. Small pulmonary nodules noted in the lung bases, largest of which
measures 5 mm in the right lower lobe. These are nonspecific. No
follow-up needed if patient is low-risk (and has no known or
suspected primary neoplasm). Non-contrast chest CT can be considered
in 12 months if patient is high-risk. This recommendation follows
the consensus statement: Guidelines for Management of Incidental
Pulmonary Nodules Detected on CT Images: From the [HOSPITAL]
4. Aortic atherosclerosis.

## 2019-10-27 ENCOUNTER — Encounter: Payer: Self-pay | Admitting: Urology

## 2019-12-01 ENCOUNTER — Telehealth: Payer: Self-pay | Admitting: Urology

## 2019-12-01 NOTE — Telephone Encounter (Signed)
I called and spoke with pt and he states that he is fine and does not want to follow up at this time.

## 2019-12-01 NOTE — Telephone Encounter (Signed)
Would you call Dustin Parsons and get him scheduled for a follow up visit for hematuria?

## 2019-12-05 ENCOUNTER — Encounter: Payer: Self-pay | Admitting: Urology

## 2020-05-21 NOTE — Progress Notes (Signed)
Certified Letter
